# Patient Record
Sex: Female | Born: 1965 | Race: Asian | Hispanic: No | Marital: Married | Smoking: Never smoker
Health system: Southern US, Community
[De-identification: ages and names within clinical notes are randomized; demographics above are authoritative.]

## PROBLEM LIST (undated history)

## (undated) DIAGNOSIS — Z8619 Personal history of other infectious and parasitic diseases: Secondary | ICD-10-CM

## (undated) DIAGNOSIS — E785 Hyperlipidemia, unspecified: Secondary | ICD-10-CM

## (undated) HISTORY — DX: Hyperlipidemia, unspecified: E78.5

## (undated) HISTORY — DX: Personal history of other infectious and parasitic diseases: Z86.19

---

## 1999-11-27 HISTORY — PX: DILATION AND CURETTAGE OF UTERUS: SHX78

## 2015-10-14 ENCOUNTER — Other Ambulatory Visit: Payer: Self-pay

## 2015-10-14 ENCOUNTER — Other Ambulatory Visit (HOSPITAL_COMMUNITY)
Admission: RE | Admit: 2015-10-14 | Discharge: 2015-10-14 | Disposition: A | Discharge status: Home / Self Care | Payer: Self-pay | Source: Ambulatory Visit | Attending: Family Medicine | Admitting: Family Medicine

## 2015-10-14 DIAGNOSIS — Z01419 Encounter for gynecological examination (general) (routine) without abnormal findings: Secondary | ICD-10-CM | POA: Insufficient documentation

## 2015-10-14 DIAGNOSIS — Z1231 Encounter for screening mammogram for malignant neoplasm of breast: Secondary | ICD-10-CM

## 2015-10-14 LAB — LIPID PANEL
CHOLESTEROL: 260 — AB (ref 0–200)
HDL: 76 — AB (ref 35–70)
LDL CALC: 175
TRIGLYCERIDES: 45 (ref 40–160)

## 2015-10-14 LAB — BASIC METABOLIC PANEL: GLUCOSE: 81

## 2015-10-18 LAB — CYTOLOGY - PAP

## 2016-11-06 ENCOUNTER — Other Ambulatory Visit: Payer: Self-pay | Admitting: Physician Assistant

## 2016-11-06 DIAGNOSIS — Z1231 Encounter for screening mammogram for malignant neoplasm of breast: Secondary | ICD-10-CM

## 2016-12-04 ENCOUNTER — Ambulatory Visit: Payer: Self-pay

## 2018-06-03 ENCOUNTER — Encounter: Payer: Self-pay | Admitting: Physician Assistant

## 2018-06-03 ENCOUNTER — Ambulatory Visit: Payer: Self-pay | Admitting: Physician Assistant

## 2018-06-03 ENCOUNTER — Ambulatory Visit (INDEPENDENT_AMBULATORY_CARE_PROVIDER_SITE_OTHER): Payer: Self-pay

## 2018-06-03 VITALS — BP 110/78 | HR 59 | Temp 98.2°F | Ht 61.0 in | Wt 139.2 lb

## 2018-06-03 DIAGNOSIS — Z7689 Persons encountering health services in other specified circumstances: Secondary | ICD-10-CM

## 2018-06-03 DIAGNOSIS — R0789 Other chest pain: Secondary | ICD-10-CM

## 2018-06-03 DIAGNOSIS — E785 Hyperlipidemia, unspecified: Secondary | ICD-10-CM | POA: Insufficient documentation

## 2018-06-03 DIAGNOSIS — T300 Burn of unspecified body region, unspecified degree: Secondary | ICD-10-CM

## 2018-06-03 DIAGNOSIS — M25562 Pain in left knee: Secondary | ICD-10-CM

## 2018-06-03 NOTE — Patient Instructions (Signed)
It was great to meet you!  Take ibuprofen per package recommendations.  Follow-up with us if symptoms worsen or persist.  If you develop any sudden onset shortness of breath or change in chest discomfort, please go to the ER.  We will contact you with your xray results.
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
## 2018-06-04 ENCOUNTER — Ambulatory Visit: Payer: Self-pay | Admitting: Sports Medicine

## 2018-06-04 ENCOUNTER — Encounter: Payer: Self-pay | Admitting: Physician Assistant

## 2018-06-04 ENCOUNTER — Encounter: Payer: Self-pay | Admitting: Sports Medicine

## 2018-06-04 VITALS — BP 112/74 | HR 69 | Ht 61.0 in | Wt 139.0 lb

## 2018-06-04 DIAGNOSIS — R0789 Other chest pain: Secondary | ICD-10-CM

## 2018-06-04 DIAGNOSIS — M25562 Pain in left knee: Secondary | ICD-10-CM
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
## 2018-08-11 ENCOUNTER — Encounter: Payer: Self-pay | Admitting: Sports Medicine

## 2018-09-15 NOTE — Progress Notes (Signed)
Ms. Catherine Stevens received her flu shot to the LT deltoid at the [facility] by the undersigned on 09/12/18.  Lot # [id number] NDC: [id number] Mfg: [id number] Exp: 05/26/19
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
## 2021-05-11 ENCOUNTER — Ambulatory Visit: Payer: Self-pay | Admitting: Family

## 2021-05-11 ENCOUNTER — Encounter: Payer: Self-pay | Admitting: Family

## 2021-05-11 ENCOUNTER — Other Ambulatory Visit: Payer: Self-pay

## 2021-05-11 VITALS — BP 101/67 | HR 67 | Temp 97.2°F | Ht 62.0 in | Wt 141.0 lb

## 2021-05-11 DIAGNOSIS — D508 Other iron deficiency anemias: Secondary | ICD-10-CM

## 2021-05-11 DIAGNOSIS — E7801 Familial hypercholesterolemia: Secondary | ICD-10-CM

## 2021-05-11 NOTE — Progress Notes (Signed)
Established Patient Office Visit  Subjective:  Patient ID: Catherine Stevens, female    DOB: [birth year]  Age: 55 y.o. MRN: [id number]  CC: No chief complaint on file.   HPI Catherine Stevens presents for 55 year old female is in today for a recheck of hyperlipidemia and anemia. She denies any concerns. She does not take any medication. She exercises routinely. Tries to follow a healthy diet but does admit to weight gain.   Past Medical History:  Diagnosis Date   History of chicken pox    Hyperlipidemia    Vaginal delivery 05/12/2002    Past Surgical History:  Procedure Laterality Date   CESAREAN SECTION  11/05/1998   DILATION AND CURETTAGE OF UTERUS  2001    Family History  Problem Relation Age of Onset   Arthritis Mother    Brain cancer Father    Hypercholesterolemia Father    Arthritis Brother     Social History   Socioeconomic History   Marital status: Married    Spouse name: Not on file   Number of children: Not on file   Years of education: Not on file   Highest education level: Not on file  Occupational History   Not on file  Tobacco Use   Smoking status: Never   Smokeless tobacco: Never  Vaping Use   Vaping Use: Never used  Substance and Sexual Activity   Alcohol use: Not on file    Comment: rarely   Drug use: Never   Sexual activity: Yes    Birth control/protection: None  Other Topics Concern   Not on file  Social History Narrative   Not on file   Social Determinants of Health   Financial Resource Strain: Not on file  Food Insecurity: Not on file  Transportation Needs: Not on file  Physical Activity: Not on file  Stress: Not on file  Social Connections: Not on file  Intimate Partner Violence: Not on file    Outpatient Medications Prior to Visit  Medication Sig Dispense Refill   fluticasone (FLONASE) 50 MCG/ACT nasal spray Place 1 spray into both nostrils as needed for allergies or rhinitis.     No facility-administered medications  prior to visit.    Allergies  Allergen Reactions   Ephedrine     ROS Review of Systems  Constitutional: Negative.   Respiratory: Negative.    Cardiovascular: Negative.   Endocrine: Negative.   Allergic/Immunologic: Negative.   All other systems reviewed and are negative.    Objective:    Physical Exam Vitals and nursing note reviewed.  Constitutional:      Appearance: Normal appearance.  Eyes:     Pupils: Pupils are equal, round, and reactive to light.  Cardiovascular:     Rate and Rhythm: Normal rate and regular rhythm.     Pulses: Normal pulses.     Heart sounds: Normal heart sounds.  Pulmonary:     Effort: Pulmonary effort is normal.     Breath sounds: Normal breath sounds.  Abdominal:     General: Abdomen is flat.  Musculoskeletal:        General: Normal range of motion.     Cervical back: Normal range of motion and neck supple.  Neurological:     General: No focal deficit present.     Mental Status: She is alert and oriented to person, place, and time.  Psychiatric:        Behavior: Behavior normal.    BP 101/67  Pulse 67   Temp (!) 97.2 F (36.2 C) (Temporal)   Ht 5' 2" (1.575 m)   Wt 141 lb (64 kg)   SpO2 98%   BMI 25.79 kg/m  Wt Readings from Last 3 Encounters:  05/11/21 141 lb (64 kg)  06/04/18 139 lb (63 kg)  06/03/18 139 lb 4 oz (63.2 kg)     Health Maintenance Due  Topic Date Due   HIV Screening  Never done   Hepatitis C Screening  Never done   COLONOSCOPY (Pts 45-65yr Insurance coverage will need to be confirmed)  Never done   MAMMOGRAM  Never done   Zoster Vaccines- Shingrix (1 of 2) Never done   PAP SMEAR-Modifier  10/13/2018    There are no preventive care reminders to display for this patient.  No results found for: TSH No results found for: WBC, HGB, HCT, MCV, PLT No results found for: NA, K, CHLORIDE, CO2, GLUCOSE, BUN, CREATININE, BILITOT, ALKPHOS, AST, ALT, PROT, ALBUMIN, CALCIUM, ANIONGAP, EGFR, GFR Lab Results   Component Value Date   CHOL 260 (A) 10/14/2015   Lab Results  Component Value Date   HDL 76 (A) 10/14/2015   Lab Results  Component Value Date   LDLCALC 175 10/14/2015   Lab Results  Component Value Date   TRIG 45 10/14/2015   No results found for: CHOLHDL No results found for: HGBA1C    Assessment & Plan:   Problem List Items Addressed This Visit     Hyperlipidemia - Primary   Relevant Orders   CBC w/Diff   Lipid Profile   Other Visit Diagnoses     Other iron deficiency anemia           Follow-up: Follow-up pending results. CPX advised but is cost prohibitive. [place] Baseline, cost-effective labs obtained. Will followup   PKennyth Arnold FNP

## 2021-05-11 NOTE — Patient Instructions (Signed)
Preventing High Cholesterol Cholesterol is a white, waxy substance similar to fat that the human body needs to help build cells. The liver makes all the cholesterol that a person's body needs. Having high cholesterol (hypercholesterolemia) increases your risk for heart disease and stroke. Extra or excess cholesterolcomes from the food that you eat. High cholesterol can often be prevented with diet and lifestyle changes. If you already have high cholesterol, you can control it with diet, lifestyle changes,and medicines. How can high cholesterol affect me? If you have high cholesterol, fatty deposits (plaques) may build up on the walls of your blood vessels. The blood vessels that carry blood away from your heart are called arteries. Plaques make the arteries narrower and stiffer. This in turn can: Restrict or block blood flow and cause blood clots to form. Increase your risk for heart attack and stroke. What can increase my risk for high cholesterol? This condition is more likely to develop in people who: Eat foods that are high in saturated fat or cholesterol. Saturated fat is mostly found in foods that come from animal sources. Are overweight. Are not getting enough exercise. Have a family history of high cholesterol (familial hypercholesterolemia). What actions can I take to prevent this? Nutrition  Eat less saturated fat. Avoid trans fats (partially hydrogenated oils). These are often found in margarine and in some baked goods, fried foods, and snacks bought in packages. Avoid precooked or cured meat, such as bacon, sausages, or meat loaves. Avoid foods and drinks that have added sugars. Eat more fruits, vegetables, and whole grains. Choose healthy sources of protein, such as fish, poultry, lean cuts of red meat, beans, peas, lentils, and nuts. Choose healthy sources of fat, such as: Nuts. Vegetable oils, especially olive oil. Fish that have healthy fats, such as omega-3 fatty acids.  These fish include mackerel or salmon.  Lifestyle Lose weight if you are overweight. Maintaining a healthy body mass index (BMI) can help prevent or control high cholesterol. It can also lower your risk for diabetes and high blood pressure. Ask your health care provider to help you with a diet and exercise plan to lose weight safely. Do not use any products that contain nicotine or tobacco, such as cigarettes, e-cigarettes, and chewing tobacco. If you need help quitting, ask your health care provider. Alcohol use Do not drink alcohol if: Your health care provider tells you not to drink. You are pregnant, may be pregnant, or are planning to become pregnant. If you drink alcohol: Limit how much you use to: 0-1 drink a day for women. 0-2 drinks a day for men. Be aware of how much alcohol is in your drink. In the [place] one drink equals one 12 oz bottle of beer (355 mL), one 5 oz glass of wine (148 mL), or one 1 oz glass of hard liquor (44 mL). Activity  Get enough exercise. Do exercises as told by your health care provider. Each week, do at least 150 minutes of exercise that takes a medium level of effort (moderate-intensity exercise). This kind of exercise: Makes your heart beat faster while allowing you to still be able to talk. Can be done in short sessions several times a day or longer sessions a few times a week. For example, on 5 days each week, you could walk fast or ride your bike 3 times a day for 10 minutes each time.  Medicines Your health care provider may recommend medicines to help lower cholesterol. This may be a medicine to lower   the amount of cholesterol that your liver makes. You may need medicine if: Diet and lifestyle changes have not lowered your cholesterol enough. You have high cholesterol and other risk factors for heart disease or stroke. Take over-the-counter and prescription medicines only as told by your health care provider. General information Manage your risk  factors for high cholesterol. Talk with your health care provider about all your risk factors and how to lower your risk. Manage other conditions that you have, such as diabetes or high blood pressure (hypertension). Have blood tests to check your cholesterol levels at regular points in time as told by your health care provider. Keep all follow-up visits as told by your health care provider. This is important. Where to find more information American Heart Association: www.heart.org National Heart, Lung, and Blood Institute: www.nhlbi.nih.gov Summary High cholesterol increases your risk for heart disease and stroke. By keeping your cholesterol level low, you can reduce your risk for these conditions. High cholesterol can often be prevented with diet and lifestyle changes. Work with your health care provider to manage your risk factors, and have your blood tested regularly. This information is not intended to replace advice given to you by your health care provider. Make sure you discuss any questions you have with your healthcare provider. Document Revised: 08/25/2019 Document Reviewed: 08/25/2019 Elsevier Patient Education  2022 [employer] Inc.  

## 2021-05-12 LAB — LIPID PANEL
Cholesterol: 241 mg/dL — ABNORMAL HIGH (ref 0–200)
HDL: 69.7 mg/dL (ref >39.00)
LDL Cholesterol: 161 mg/dL — ABNORMAL HIGH (ref 0–99)
NonHDL: 170.95
Total CHOL/HDL Ratio: 3
Triglycerides: 48 mg/dL (ref 0.0–149.0)
VLDL: 9.6 mg/dL (ref 0.0–40.0)

## 2021-05-12 LAB — CBC WITH DIFFERENTIAL/PLATELET
Basophils Absolute: 0.1 10*3/uL (ref 0.0–0.1)
Basophils Relative: 1.1 % (ref 0.0–3.0)
Eosinophils Absolute: 0.2 10*3/uL (ref 0.0–0.7)
Eosinophils Relative: 2.9 % (ref 0.0–5.0)
HCT: 34.3 % — ABNORMAL LOW (ref 36.0–46.0)
Hemoglobin: 11.8 g/dL — ABNORMAL LOW (ref 12.0–15.0)
Lymphocytes Relative: 41.8 % (ref 12.0–46.0)
Lymphs Abs: 3 10*3/uL (ref 0.7–4.0)
MCHC: 34.3 g/dL (ref 30.0–36.0)
MCV: 83.9 fl (ref 78.0–100.0)
Monocytes Absolute: 0.5 10*3/uL (ref 0.1–1.0)
Monocytes Relative: 6.8 % (ref 3.0–12.0)
Neutro Abs: 3.4 10*3/uL (ref 1.4–7.7)
Neutrophils Relative %: 47.4 % (ref 43.0–77.0)
Platelets: 273 10*3/uL (ref 150.0–400.0)
RBC: 4.09 Mil/uL (ref 3.87–5.11)
RDW: 14 % (ref 11.5–15.5)
WBC: 7.2 10*3/uL (ref 4.0–10.5)

## 2021-05-15 ENCOUNTER — Telehealth: Payer: Self-pay

## 2021-05-15 NOTE — Telephone Encounter (Signed)
Spoke to pt told her Catherine Stevens said, Patient's cholesterol is elevated. At this point, medication is recommended. Decrease intake of white breads, pasta, potatoes. Continue exercising and ensuring at least 45 minutes of cardio, 4 days a week. Is she willing to start cholesterol medication? Pt verbalized understanding and said she does not eat that stuff has cut out carbs and is exercising again regularly. Pt asked side effects of cholesterol medication? Told pt the main one is muscle cramps. Pt verbalized understanding and said she is undecided.Told pt if she would like can do diet and exercise for 3 months and repeat labs then go on medication. Pt said she is going to think about it and get back to [place]. Told her okay just let us know what you want to do. Pt verbalized understanding.

## 2021-05-15 NOTE — Telephone Encounter (Signed)
Padonda, pt is calling about lab results from 6/16. Please advise.

## 2021-05-15 NOTE — Telephone Encounter (Signed)
Patient is calling in asking if someone can give her a call about lab results. Advised that Padonda hasn't seen them yet and would need to wait until she sees them before someone would be able to return a call.

## 2021-12-06 ENCOUNTER — Other Ambulatory Visit: Payer: Self-pay | Admitting: Physician Assistant

## 2021-12-06 DIAGNOSIS — Z1231 Encounter for screening mammogram for malignant neoplasm of breast: Secondary | ICD-10-CM

## 2021-12-21 ENCOUNTER — Ambulatory Visit: Payer: Self-pay

## 2021-12-21 ENCOUNTER — Ambulatory Visit
Admission: RE | Admit: 2021-12-21 | Discharge: 2021-12-21 | Disposition: A | Discharge status: Home / Self Care | Payer: BC Managed Care – PPO | Source: Ambulatory Visit | Attending: Physician Assistant | Admitting: Physician Assistant

## 2021-12-21 DIAGNOSIS — Z1231 Encounter for screening mammogram for malignant neoplasm of breast: Secondary | ICD-10-CM

## 2021-12-26 ENCOUNTER — Other Ambulatory Visit: Payer: Self-pay | Admitting: Obstetrics and Gynecology

## 2021-12-26 DIAGNOSIS — Z780 Asymptomatic menopausal state: Secondary | ICD-10-CM

## 2022-01-04 ENCOUNTER — Other Ambulatory Visit: Payer: Self-pay

## 2022-01-04 ENCOUNTER — Ambulatory Visit
Admission: RE | Admit: 2022-01-04 | Discharge: 2022-01-04 | Disposition: A | Discharge status: Home / Self Care | Payer: BC Managed Care – PPO | Source: Ambulatory Visit | Attending: Obstetrics and Gynecology | Admitting: Obstetrics and Gynecology

## 2022-01-04 DIAGNOSIS — Z780 Asymptomatic menopausal state: Secondary | ICD-10-CM

## 2022-08-20 ENCOUNTER — Encounter: Payer: Self-pay | Admitting: *Deleted

## 2022-12-17 ENCOUNTER — Ambulatory Visit: Payer: BC Managed Care – PPO | Admitting: Dietician

## 2023-01-28 ENCOUNTER — Other Ambulatory Visit: Payer: Self-pay | Admitting: Obstetrics and Gynecology

## 2023-01-28 DIAGNOSIS — Z1231 Encounter for screening mammogram for malignant neoplasm of breast: Secondary | ICD-10-CM

## 2023-02-12 ENCOUNTER — Ambulatory Visit
Admission: RE | Admit: 2023-02-12 | Discharge: 2023-02-12 | Disposition: A | Discharge status: Home / Self Care | Payer: No Typology Code available for payment source | Source: Ambulatory Visit | Attending: Obstetrics and Gynecology | Admitting: Obstetrics and Gynecology

## 2023-02-12 ENCOUNTER — Ambulatory Visit: Payer: Self-pay | Admitting: *Deleted

## 2023-02-12 VITALS — BP 114/76 | Wt 146.8 lb

## 2023-02-12 DIAGNOSIS — Z1239 Encounter for other screening for malignant neoplasm of breast: Secondary | ICD-10-CM

## 2023-02-12 DIAGNOSIS — Z1231 Encounter for screening mammogram for malignant neoplasm of breast: Secondary | ICD-10-CM

## 2023-02-12 DIAGNOSIS — Z1211 Encounter for screening for malignant neoplasm of colon: Secondary | ICD-10-CM

## 2023-02-12 NOTE — Progress Notes (Signed)
Ms. Catherine Stevens is a 57 y.o. female who presents to [facility] clinic today with no complaints.    Pap Smear: Pap smear not completed today. Last Pap smear was in 2023 at the [facility] clinic and was normal per patient. Per patient has no history of an abnormal Pap smear. Last Pap smear result is not available in Epic.   Physical exam: Breasts Breasts symmetrical. No skin abnormalities bilateral breasts. No nipple retraction bilateral breasts. No nipple discharge bilateral breasts. No lymphadenopathy. No lumps palpated bilateral breasts. No complaints of pain or tenderness on exam.     MM 3D SCREEN BREAST BILATERAL  Result Date: 12/21/2021 CLINICAL DATA:  Screening. EXAM: DIGITAL SCREENING BILATERAL MAMMOGRAM WITH TOMOSYNTHESIS AND CAD TECHNIQUE: Bilateral screening digital craniocaudal and mediolateral oblique mammograms were obtained. Bilateral screening digital breast tomosynthesis was performed. The images were evaluated with computer-aided detection. COMPARISON:  None. ACR Breast Density Category c: The breast tissue is heterogeneously dense, which may obscure small masses FINDINGS: There are no findings suspicious for malignancy. IMPRESSION: No mammographic evidence of malignancy. A result letter of this screening mammogram will be mailed directly to the patient. RECOMMENDATION: Screening mammogram in one year. (Code:[id number]) BI-RADS CATEGORY  1: Negative. Electronically Signed   By: Catherine Stevens M.D.   On: 12/21/2021 09:20     Pelvic/Bimanual Pap is not indicated today per BCCCP guidelines.   Smoking History: Patient has never smoked.   Patient Navigation: Patient education provided. Access to services provided for patient through [facility] program.   Colorectal Cancer Screening: Per patient had a colonoscopy completed in 2023. FIT Test given to patient to complete. No complaints today.    Breast and Cervical Cancer Risk Assessment: Patient has family history of a maternal aunt having  breast cancer. Patient has no known genetic mutations or history of radiation treatment to the chest before age 55. Patient does not have history of cervical dysplasia, immunocompromised, or DES exposure in-utero.  Risk Scores as of 02/12/2023     Catherine Stevens           5-year 1.05 %   Lifetime 4.53 %            Last calculated by Catherine Stevens, CMA on 02/12/2023 at  1:58 PM        A: B[facility]CP exam without pap smear No complaints.  P: Referred patient to the [facility] for a screening mammogram on mobile unit. Appointment scheduled Tuesday, February 12, 2023 at 1430.  Catherine Parish, RN 02/12/2023 2:14 PM

## 2023-02-12 NOTE — Patient Instructions (Signed)
Explained breast self awareness with Tye Maryland. Patient did not need a Pap smear today due to last Pap smear was in 2023 per patient. Let her know B[facility]CP will cover Pap smears every 3 years unless has a history of abnormal Pap smears. Referred patient to the [facility] for a screening mammogram on mobile unit. Appointment scheduled Tuesday, February 12, 2023 at 1430. Patient aware of appointment and will be there. Let [facility] know [facility] will [facility] up with her within the next couple weeks with results of her mammogram by letter or phone. Tye Maryland verbalized understanding.  Kentrell Hallahan, Arvil Chaco, RN 2:14 PM
